# Patient Record
Sex: Male | Born: 1985 | Race: Asian | Hispanic: No | Marital: Married | Smoking: Former smoker
Health system: Southern US, Community
[De-identification: ages and names within clinical notes are randomized; demographics above are authoritative.]

## PROBLEM LIST (undated history)

## (undated) DIAGNOSIS — J939 Pneumothorax, unspecified: Secondary | ICD-10-CM

## (undated) HISTORY — DX: Pneumothorax, unspecified: J93.9

---
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
## 2007-01-02 ENCOUNTER — Ambulatory Visit: Payer: Self-pay | Admitting: Cardiothoracic Surgery

## 2007-01-02 ENCOUNTER — Inpatient Hospital Stay (HOSPITAL_COMMUNITY): Admission: AD | Admit: 2007-01-02 | Discharge: 2007-01-10 | Payer: Self-pay | Admitting: Surgery

## 2007-01-03 ENCOUNTER — Encounter: Payer: Self-pay | Admitting: Cardiothoracic Surgery

## 2007-01-17 ENCOUNTER — Encounter: Admission: RE | Admit: 2007-01-17 | Discharge: 2007-01-17 | Payer: Self-pay | Admitting: Cardiothoracic Surgery

## 2007-01-17 ENCOUNTER — Ambulatory Visit: Payer: Self-pay | Admitting: Cardiothoracic Surgery

## 2007-01-31 ENCOUNTER — Ambulatory Visit: Payer: Self-pay | Admitting: Cardiothoracic Surgery

## 2007-01-31 ENCOUNTER — Encounter: Admission: RE | Admit: 2007-01-31 | Discharge: 2007-01-31 | Payer: Self-pay | Admitting: Cardiothoracic Surgery

## 2007-06-18 IMAGING — CR DG CHEST 2V
2 series · 2 of 2 positions shown · non-contrast
Comparison: 01/09/2007

CLINICAL DATA: Followup spontaneous left pneumothorax.
 CHEST - 2 VIEW:

[w chest pa]
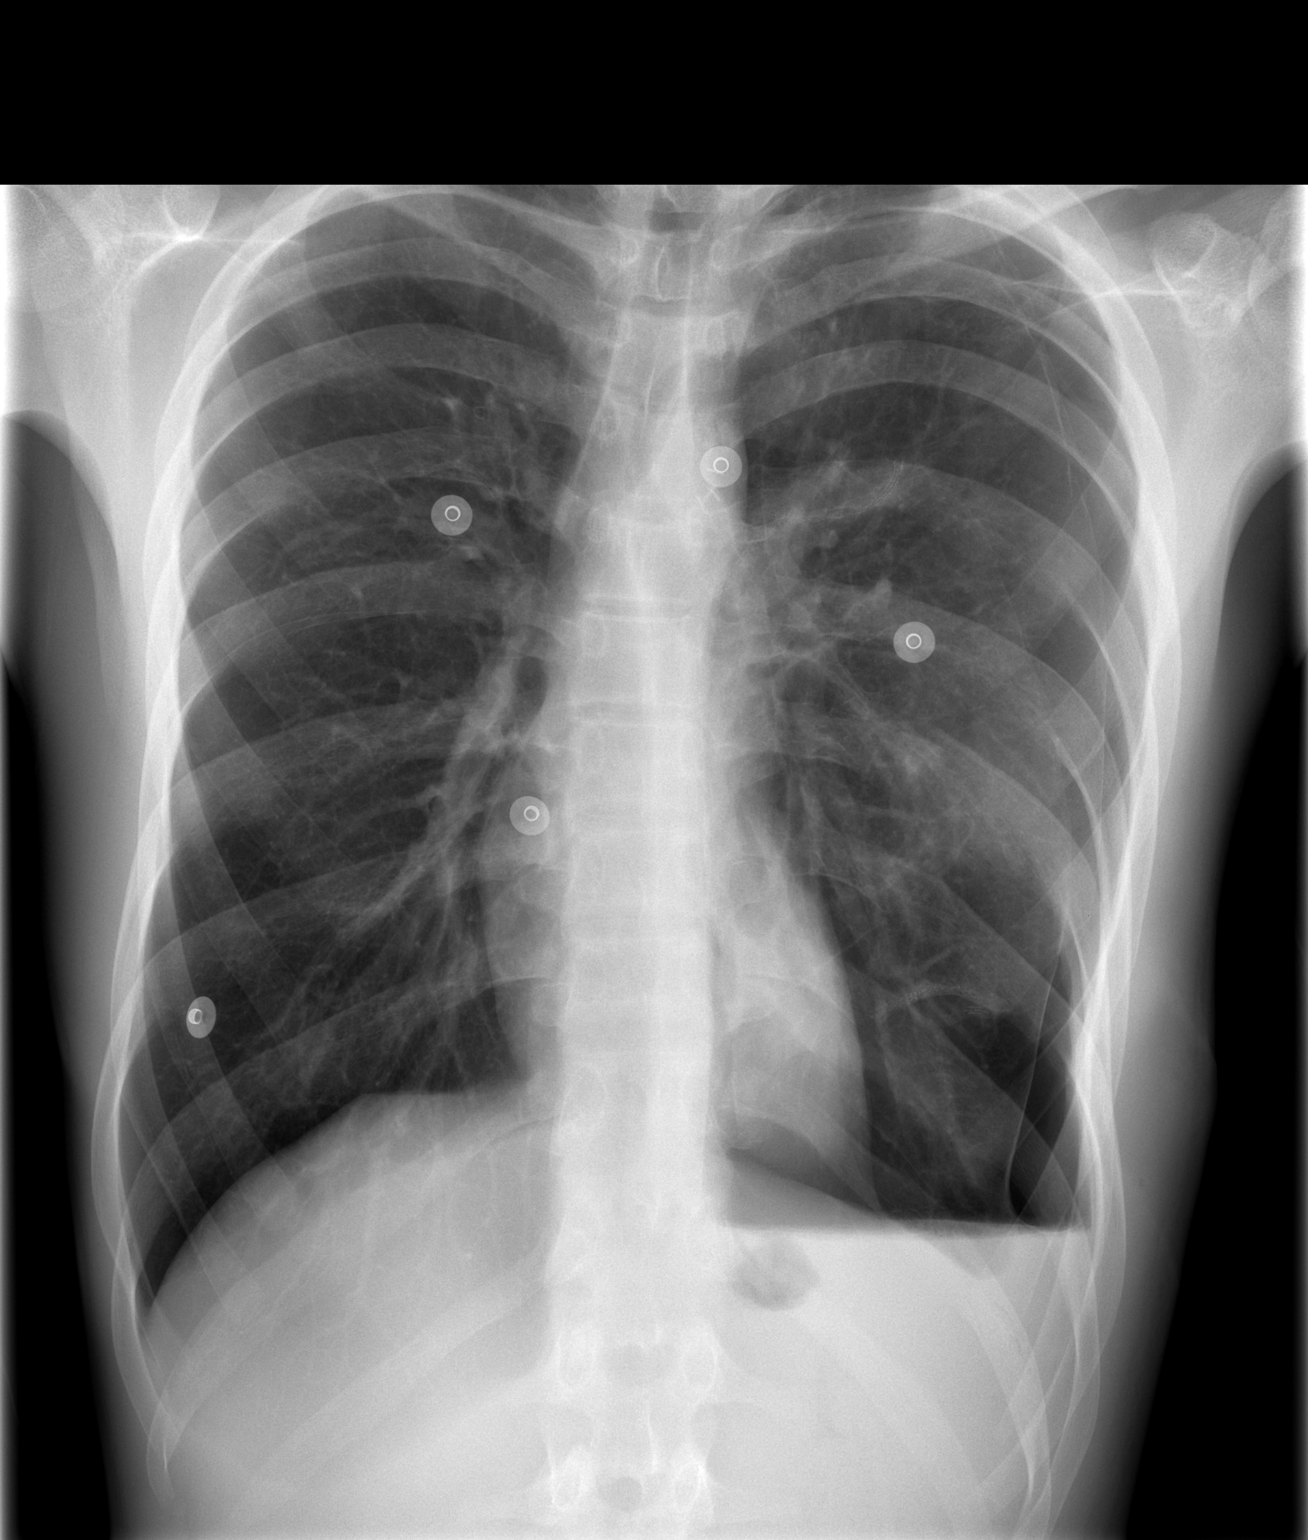

[w chest lat]
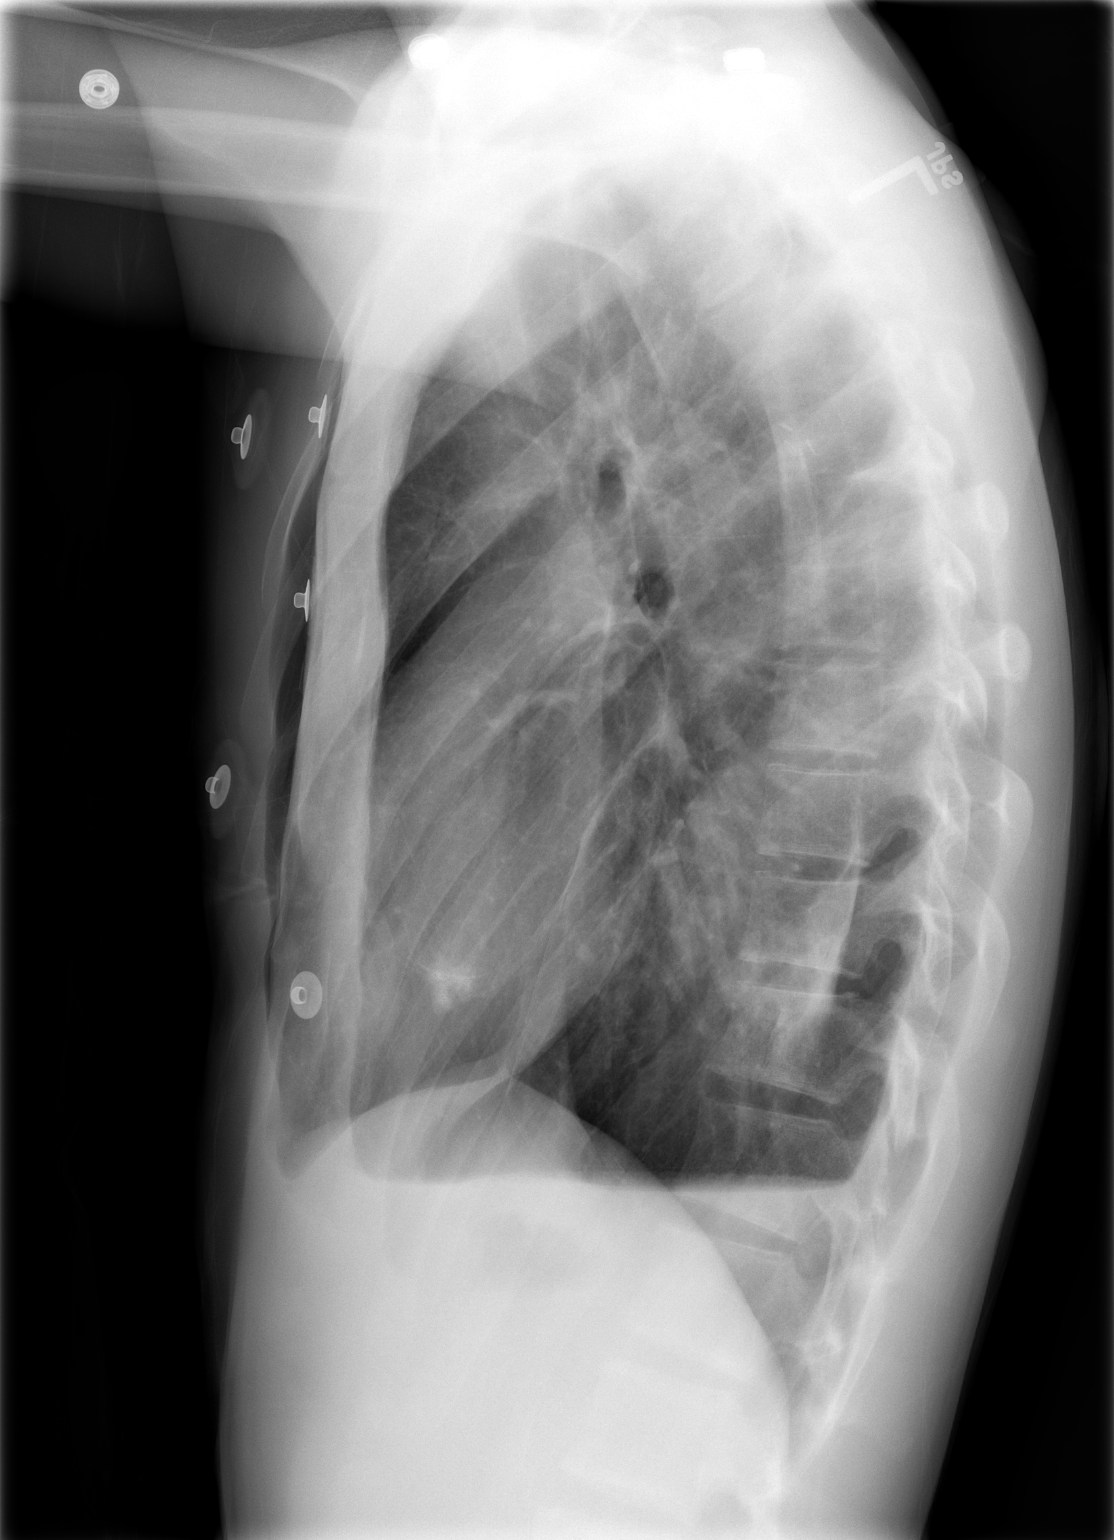

[2 of 2 positions shown; findings below may reference images not displayed]

FINDINGS: Moderate left hydropneumothorax has not significantly changed in size.  Both lungs remain clear.  Heart size is normal.
IMPRESSION: Stable left hydropneumothorax.

## 2011-03-02 NOTE — Op Note (Signed)
NAME:  Gerald Mcdaniel, Gerald Mcdaniel                   ACCOUNT NO.:  [id number]   MEDICAL RECORD NO.:  [id number]          PATIENT TYPE:  INP   LOCATION:  2311                         FACILITY:  [facility]   PHYSICIAN:  Kerin Perna, M.D.  DATE OF BIRTH:  Mar 09, 1986   DATE OF PROCEDURE:  01/03/2007  DATE OF DISCHARGE:                               OPERATIVE REPORT   OPERATION:  Left video assisted thoracoscopic surgery, mini-thoracotomy  with wedge resection of left upper lobe bleb and pleurectomy of the  parietal pleura of the left hemithorax.   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   POSTOPERATIVE DIAGNOSIS:  Recurrent spontaneous pneumothorax status post video assisted  thoracoscopic surgery and pleurodesis and bleb resection in April 2007.   SURGEON:  Kerin Perna, M.D.   ASSISTANT:  Coral Ceo, PA-C   ANESTHESIA:  General.   INDICATIONS:  The patient is a 25 year old Asian male who had an episode  of spontaneous pneumothorax in April 2007 which did not resolve with a  chest tube.  He underwent a left VATS bleb resection and pleurodesis by  Dr. Laneta Simmers and did well until 48 hours prior to admission when he  developed recurrent pleuritic chest and shoulder pain on the left side  and was found to have a 20-25% recurrent left pneumothorax.  A CT scan  showed possible residual blebs in the left apex which were small.  It  was felt that a pleurectomy would be the best operation in this young  man with already recurrence of a significant pneumothorax a year after  prior VATS.  I discussed the procedure with the patient and his parents  including alternatives to surgery and the risks including incisional  pain, bleeding, persistent air leak, infection.  The patient  demonstrated his understanding and agreed to proceed.   PROCEDURE:  The patient was brought to operating room  and placed supine  on the operating table where the left chest was prepped and draped after  the appropriate preoperative time out was completed.  The left pleural  space was examined with the thoracoscope through a small incision in the  fifth interspace.  There were very minimal adhesions in the pleural  space only at the left apex.  There were blebs which were small seen at  the lingular area and also in the posterior aspect of the left upper  lobe at the apex.  The camera was then removed and the incision was  extended in the fifth interspace.  The lung was mobilized.  The area of  bleb disease in the lingula was resected with an Endo-GIA stapler.  The  area of bleb disease in the posterior aspect of the left upper lobe  apical area was resected with an Endo-GIA staple incision.  A pleural  parietal pleurectomy was then performed anteriorly and posteriorly from  the apex to the seventh interspace.  Bleeding was controlled with  electrocautery and Liga clips.  The area was irrigated and inspected  with  the camera and found to have an adequate circumferential  pleurectomy.  A 36 straight chest tube was placed and directed to the  apex posteriorly.  Pericostals of #2 Vicryl were used to reapproximate  the ribs.  The  muscle was closed with interrupted #1 Vicryl.  An On-Q wound catheter  irrigation system was then placed and secured to the skin.  The  subcutaneous layer and skin were then closed with running Vicryl.  Sterile dressings were applied and the patient was extubated in the  operating room and returned to the recovery room in stable condition.      Kerin Perna, M.D.  Electronically Signed     PV/MEDQ  D:  01/03/2007  T:  01/03/2007  Job:  [id number]

## 2011-03-02 NOTE — H&P (Signed)
NAME:  Gerald Mcdaniel, Gerald Mcdaniel                   ACCOUNT NO.:  [id number]   MEDICAL RECORD NO.:  [id number]          PATIENT TYPE:  INP   LOCATION:  2004                         FACILITY:  [facility]   PHYSICIAN:  Kerin Perna, M.D.  DATE OF BIRTH:  April 13, 1986   DATE OF ADMISSION:  01/02/2007  DATE OF DISCHARGE:                              HISTORY & PHYSICAL   ADMISSION DIAGNOSIS:  Recurrent left spontaneous pneumothorax.   CHIEF COMPLAINT:  Left chest and shoulder pain.   HISTORY OF PRESENT ILLNESS:  Mr. Wotton is a 25 year old [place] male with  a history of left spontaneous pneumothorax. In March of 2007 he was  admitted by Dr. Laneta Simmers for a total left spontaneous pneumothorax and was  treated with a chest tube. Because of a persistent air leak he underwent  a left VATS with stapling of blebs and pleurodesis on January 18, 2006.  He  had an uncomplicated recovery and did well. He was seen in the emergency  department in February with some left shoulder discomfort and a chest x-  ray at that time showed a 5% left apical pneumothorax. He subsequently  cleared his symptoms and did well until early this morning when he  presented with recurrent left shoulder and neck discomfort to the  emergency department at [facility] where a chest x-ray  demonstrated a left 20% spontaneous pneumothorax. There is no evidence  of pulmonary infiltrate or pleural effusion.  Because of the recurrent  pneumothorax, a thoracic surgical evaluation was requested. The patient  denies any history of falls or trauma to the chest. He denies coughing.  He is a non cigarette smoker.   PAST MEDICAL HISTORY:  1. No known allergies.  2. No medications.  3. No prior surgical procedures other than the left VATS, apical bleb      stapling in April, 2007. He is a nonsmoker.   FAMILY HISTORY:  Negative for spontaneous pneumothorax.   REVIEW OF SYSTEMS:  Negative for fever, weight loss, hoarseness or  difficulty swallowing.  THORACIC: Review is positive for his recurrent  left pneumothorax, 20%. CARDIAC: Review is negative for arrhythmia,  murmur or rheumatic heart disease. GI review is negative for hepatitis  or jaundice. ENDOCRINE: Review is negative for diabetes or thyroid  disease. VASCULAR: Review is negative for DVT or TIA. NEUROLOGIC: Review  is negative for stroke or seizure or concussion.   PHYSICAL EXAMINATION:  GENERAL: The patient is a 25 year old young, thin  white Asian male in the emergency department in no distress.  HEENT EXAM: Normocephalic. Full EOMs. Pharynx clear. Dentition good.  NECK: Without crepitus or mass.  LYMPHATICS: No palpable cervical or supraclavicular adenopathy.  LUNGS: Breath sounds are slightly diminished on the left. He has a well-  healed left VATS incision. Breath sounds on the right are clear.  CARDIAC EXAM: Regular rhythm without S3 gallop or murmur.  ABDOMEN: Exam is soft, scaphoid without focal tenderness or pulsatile  mass.  EXTREMITIES: Reveal no edema, cyanosis or clubbing.  VASCULAR EXAM: Reveals 2+ pulses in all extremities.  SKIN: Without rash or  lesion.  NEUROLOGIC EXAM: Alert and oriented without focal motor deficit.   LABORATORY DATA:  A chest x-ray is reviewed and shows a lateral left  pneumothorax extending from the apex down to the diaphragm. It is  estimated at 20%.  The patient underwent a subsequent CT scan of the chest. This shows some  postoperative adhesions in the left apex. He has a few small apical  blebs.  He has no visible blebs in the lower lobe. The pneumothorax on  CT scan is 20-25%.   IMPRESSION AND PLAN:  The patient has had a recurrent pneumothorax  despite a left VATS with apical bleb stapling and pleurodesis. The  patient would probably benefit from a second procedure to do a  pleurectomy to ensure that this does not keep repeating. This will be  discussed with the patient and family and scheduled within the next 24  to 48  hours.      Kerin Perna, M.D.  Electronically Signed     PV/MEDQ  D:  01/02/2007  T:  01/02/2007  Job:  [id number]   cc:   CVTS Office

## 2011-03-02 NOTE — Discharge Summary (Signed)
NAME:  Gerald Mcdaniel, Gerald Mcdaniel                   ACCOUNT NO.:  [id number]   MEDICAL RECORD NO.:  [id number]          PATIENT TYPE:  INP   LOCATION:  5736                         FACILITY:  [facility]   PHYSICIAN:  Evelene Croon, M.D.     DATE OF BIRTH:  Jul 04, 1986   DATE OF ADMISSION:  01/12/2006  DATE OF DISCHARGE:  01/19/2006                                 DISCHARGE SUMMARY   ADMISSION DIAGNOSIS:  Spontaneous left pneumothorax.   DISCHARGE DIAGNOSIS:  Spontaneous left pneumothorax, status post left video-  assisted thoracoscopy and left apical bleb stapling.   PROCEDURE:  Left VATs with stapling of apical blebs and mechanical  pleurodesis by Dr. Evelene Croon.   SERVICE:  CVTS.   CONSULTATIONS:  None.   HISTORY AND PHYSICAL EXAM:  This is an 25 year old healthy Asian gentleman  who developed left chest pain on January 11, 2006.  The chest pain had  persisted throughout the day and he developed shortness of breath on January 12, 2006.  Subsequently, the patient went to [facility] Emergency  Room  where he had a chest x-ray taken which showed a large left  pneumothorax.  Chest x-ray repeated showed a left pneumothorax which shifted  to the right.  The patient denies any previous or surgical illnesses.  He  does smoke one pack per day since age 56.   On exam, the patient was a thin Asian gentleman in no acute distress.  He  had decreased breath sounds at the left base of his lung.  Heart regular  rate and rhythm.   HOSPITAL COURSE:  Chest tube was inserted on January 12, 2006 in the left  pleural space without any difficulty.  A chest x-ray followed showed the  tube was in good position without pneumothorax.   On January 13, 2006, the patient remained afebrile with vital signs stable.  His lungs are clear.  His chest tube did show a small air leak; however, the  chest x-ray did not show a pneumothorax.  The patient was placed still to  suction.   On January 14, 2006, the patient still had a  small air leak.  Chest x-ray did  show no pneumothorax.   On January 15, 2006, there was still a small air leak and it was best thought  the patient undergo a VATs.   On January 16, 2006, the patient underwent a left VATs and stapling of apical  bleb and mechanical pleurodesis.  The patient tolerated this procedure well.  On postoperative day one, the patient remained afebrile.  Vital signs  stable.  He had decreased breath sounds at the left base.  His chest tube  showed no air leak and his chest x-ray was stable.  His chest tube was  placed to water seal on January 17, 2006.  On January 18, 2006, the patient  remained stable without chest pain or shortness of breath.  He remained  afebrile.  He does still have some decrease breath sounds to the left base.  His chest x-ray was stable without pneumothorax and his chest  tube was  without air leak.   PHYSICAL EXAMINATION:  CVS:  Regular rate and rhythm.  RESPIRATORY:  Decreased sounds at the bases.  ABDOMEN:  Positive bowel sounds.  Soft, nontender, and nondistended.  EXTREMITIES:  No edema.  Pulses 2+.  Incisions clear, dry and intact.  CHEST:  Chest tube will be removed on January 18, 2006.  A follow-up chest x-  ray will be taken.   CONDITION ON DISCHARGE:  Good.   DISPOSITION:  The patient will be discharged to home.   DISCHARGE MEDICATIONS:  Tylox 1-2 tablets p.o. every four hours p.r.n.   DISCHARGE INSTRUCTIONS:  The patient is instructed to follow a regular diet.  He is to do no driving for two weeks.  The patient is do no heavy lifting  greater than 10 pounds for three weeks. The patient is to increase activity  slowly.  He may shower, bathe and clean his wounds with mild soap and water.  The patient was instructed to call the office if any wound problems such as  erythema, drainage, temperature greater than 101.5 or an increase in  shortness of breath.   FOLLOW UP:  The patient has a follow-up appointment with Dr. Evelene Croon in  one  week with a chest x-ray.      Constance Holster, [place]      Evelene Croon, M.D.  Electronically Signed    JMW/MEDQ  D:  01/18/2006  T:  01/19/2006  Job:  [id number]   cc:   Patient's chart   Evelene Croon, M.D.  [address]  [place]  [place] [address]

## 2011-03-02 NOTE — Op Note (Signed)
NAME:  Gerald Mcdaniel, Gerald Mcdaniel                   ACCOUNT NO.:  [id number]   MEDICAL RECORD NO.:  [id number]          PATIENT TYPE:  INP   LOCATION:  3305                         FACILITY:  [facility]   PHYSICIAN:  Evelene Croon, M.D.     DATE OF BIRTH:  1986-09-28   DATE OF PROCEDURE:  01/16/2006  DATE OF DISCHARGE:                                 OPERATIVE REPORT   PREOPERATIVE AND POSTOPERATIVE DIAGNOSIS:  Spontaneous left pneumothorax.   OPERATIVE PROCEDURE:  Left video-assisted thoracoscopy with stapling of left  apical bleb and mechanical pleurodesis.   ATTENDING SURGEON:  Evelene Croon, M.D.   ASSISTANT:  Belenda Cruise [facility]   ANESTHESIA:  General endotracheal.   CLINICAL HISTORY:  This patient is an 25 year old gentleman who was admitted  about 4 days ago with a spontaneous left pneumothorax with complete collapse  of his left lung.  He underwent chest tube placement in the emergency room  with complete re-expansion of this lung.  He continued to have a small  intermittent air leak from the chest tube.  After 72 hours, I felt that it  would be best to proceed with surgical correction since there was no change  in the small intermittent air leak.  I discussed operative procedure of  video-assisted thoracoscopy with stapling of blebs with the patient and his  mother.  I discussed the alternative of continuing chest tube drainage.  I  discussed the benefits and risks of surgery including bleeding, infection,  injury to lung, air leak, and recurrent pneumothorax.  He understood and  agreed to proceed.   OPERATIVE PROCEDURE:  The patient was taken to the operating room, placed on  table supine position.  After induction of general endotracheal anesthesia,  the patient was positioned in the right lateral decubitus position with the  left side up.  Then the left side of the chest prepped with Betadine soap  and solution and draped usual sterile manner.  A time-out was taken and the  proper  patient, proper operative site and proper operation were confirmed  the nursing and anesthesia staff.   Then a small transverse incision was made in the mid axillary line over the  seventh intercostal space.  The pleural space was entered and a 10 mm trocar  was inserted.  The 0 degrees thoracoscope was inserted.  Examination of  pleural space showed no visible abnormality.  There were a few bubbles  present near the apex of the lung.  Examination of lung itself showed that  there was a small bleb at the apex of the lung.  There were no other  abnormalities noted in the upper or lower lobes.  I also carefully inspected  the superior segment of the left lower lobe and saw no visible blebs.  Then  another small incision was made in the auscultatory triangle on the left  side of the back.  Through this area a linear noncutting stapler was  inserted.  A clamp was then inserted through the previous chest tube  incision in the anterior axillary line and the apex of the  lung was grasped.  The area of the apical bleb was then stapled with a single firing of the  noncutting stapler.  This resulted in complete exclusion of the apical bleb.  There is complete hemostasis.  The stapler was removed.  Then a mechanical  pleurodesis was performed using a small piece of an abrasivecauterypad  cleaner.  This was used to abrade the parietal pleural surface over the  upper one half of the chest.  Care was taken not to injure any of the vital  structures.  This abrasive pad was then removed.  Then a 28-French chest  tube was reinserted through the anterior axillary incision and positioned  with the tip near the apex of the chest.  The left lung was then re-  expanded.  There is no visible air leak.  Then the two incisions were closed  with a 2-0 Vicryl subcutaneous suture and a 3-0 Vicryl subcuticular skin  closure.  The sponge, needles and instrument counts correct according to  scrub nurse.  Dry sterile  dressing applied over the incisions and around the  chest tube which was hooked to Pleur-Evac suction.  The patient was then  awakened, extubated and transferred to the post anesthesia care unit in  satisfactory and stable condition.      Evelene Croon, M.D.  Electronically Signed     BB/MEDQ  D:  01/16/2006  T:  01/17/2006  Job:  [id number]   cc:   CVTS office

## 2011-03-02 NOTE — Discharge Summary (Signed)
NAME:  Gerald Mcdaniel, Gerald Mcdaniel                   ACCOUNT NO.:  [id number]   MEDICAL RECORD NO.:  [id number]          PATIENT TYPE:  INP   LOCATION:  2038                         FACILITY:  [facility]   PHYSICIAN:  Kerin Perna, M.D.  DATE OF BIRTH:  23-Jun-1986   DATE OF ADMISSION:  01/02/2007  DATE OF DISCHARGE:  01/10/2007                               DISCHARGE SUMMARY   PRIMARY ADMITTING DIAGNOSIS:  Recurrent left spontaneous pneumothorax.   ADDITIONAL/DISCHARGE DIAGNOSES:  1. Recurrent left spontaneous pneumothorax.  2. History of left video-assisted thoracoscopic surgery (VATS) with      stapling of blebs and pleurodesis in April 2007 by Dr. Laneta Simmers.   PROCEDURES PERFORMED:  Left VATS and left mini-thoracotomy with left  upper lobe wedge resection and pleurectomy.   HISTORY:  The patient is a 25 year old Asian male with a history of  spontaneous left pneumothorax which occurred in April 2007.  He was seen  at that time by Dr. Evelene Croon and a chest tube was placed without  resolution of his pneumothorax.  He subsequently underwent a left VATS  with lobe resection and pleurodesis by Dr. Laneta Simmers.  He was discharged  home in good condition and has done well since that time until about 48  hours prior to this admission when he developed recurrent pleuritic  chest and shoulder pain on the left side.   He presented to the emergency department for further evaluation at  [facility], and a chest x-ray showed a recurrent left  spontaneous pneumothorax about 20-25%.  Because of this, a  Cardiothoracic Surgery consultation was obtained and the patient was  seen by Dr. Kathlee Nations Trigt.  Dr. Donata Clay recommended placing chest  tube and admitting the patient for further evaluation and treatment.  He  was subsequently transferred to [facility] and a chest tube was  placed and he was admitted for followup care.   HOSPITAL COURSE:  A CT scan was performed, which showed possible  residual blebs in the left apex, which were small.  Because the patient  had had a recurrence so soon after his previous surgery, it was  recommended that he undergo a VATS with lobe resection and pleurectomy  at this time.  Dr. Donata Clay explained the risks, benefits and  alternatives of the procedure to the patient and family, and they agreed  to proceed with surgery.   He was taken to the operating room on 01/03/2007 and underwent a left  VATS with left upper lobe bleb resection and pleurectomy.  He tolerated  the procedure well and was transferred to the floor in stable condition.   His postoperative course was uneventful initially.  His chest tube was  removed on postop day #3 after no air leak was noted and his x-rays had  remained stable.  Following removal of his chest tube, he had a  recurrence of a pneumothorax, up to 20% on the left.  Because of this,  he was sent to Interventional Radiology for placement of pigtail chest  tube.  This was done on 01/07/2007.  Following placement of a pigtail  catheter, his lung re-expanded and there was minimal residual  pneumothorax.  His chest tube was followed for the next 48 hours.  He  was decreased to water seal.  His chest tube was able to be removed on  01/09/2007.  Following the removal of his chest tube, he had a minimal  10-15% pneumothorax which has remained stable over the ensuing 24 hours.  He is otherwise doing very well.  He has remained afebrile and all vital  signs have been stable.  His incisions are all healing well.  He is  ambulating in the halls without difficulty.  He is tolerating a regular  diet and is having normal bowel and bladder function.  He has been seen  today and his films have been reviewed by Dr. Donata Clay, and it is felt  that since his pneumothorax has remained stable, he may be discharged  home at this time.   DISCHARGE INSTRUCTIONS:  He is asked to refrain from driving, heavy  lifting or strenuous  activity.  He may continue ambulating daily and  using his incentive spirometer.  He may shower daily and clean his  incisions with soap and water.  He will continue with same preoperative  diet.   DISCHARGE MEDICATIONS:  Vicodin 5/500, one to two q.4 h p.r.n. for pain.   DISCHARGE FOLLOWUP:  He will see Dr. Donata Clay back in the office in 1  week, and our office will be contacting the patient with an appointment  as well as the time for a chest x-ray at [facility], which  will be approximately 1 hour prior to this appointment in our office.  In the interim, if he experiences any problems or has questions, he is  asked to contact our office immediately.      Coral Ceo, P.A.      Kerin Perna, M.D.  Electronically Signed    GC/MEDQ  D:  01/10/2007  T:  01/10/2007  Job:  [id number]   cc:   Kerin Perna, M.D.

## 2016-06-03 DIAGNOSIS — R0789 Other chest pain: Secondary | ICD-10-CM | POA: Diagnosis not present

## 2016-06-03 DIAGNOSIS — J9383 Other pneumothorax: Secondary | ICD-10-CM | POA: Diagnosis not present

## 2016-06-03 DIAGNOSIS — J939 Pneumothorax, unspecified: Secondary | ICD-10-CM | POA: Diagnosis not present

## 2016-06-03 DIAGNOSIS — R0602 Shortness of breath: Secondary | ICD-10-CM | POA: Diagnosis not present

## 2016-06-04 DIAGNOSIS — J939 Pneumothorax, unspecified: Secondary | ICD-10-CM | POA: Diagnosis not present

## 2016-06-05 DIAGNOSIS — Z48813 Encounter for surgical aftercare following surgery on the respiratory system: Secondary | ICD-10-CM | POA: Diagnosis not present

## 2016-06-06 DIAGNOSIS — J939 Pneumothorax, unspecified: Secondary | ICD-10-CM | POA: Diagnosis not present

## 2016-06-06 DIAGNOSIS — G8918 Other acute postprocedural pain: Secondary | ICD-10-CM | POA: Diagnosis not present

## 2016-06-07 DIAGNOSIS — G8918 Other acute postprocedural pain: Secondary | ICD-10-CM | POA: Diagnosis not present

## 2016-06-07 DIAGNOSIS — Z48813 Encounter for surgical aftercare following surgery on the respiratory system: Secondary | ICD-10-CM | POA: Diagnosis not present

## 2016-06-08 DIAGNOSIS — G8918 Other acute postprocedural pain: Secondary | ICD-10-CM | POA: Diagnosis not present

## 2016-06-08 DIAGNOSIS — J939 Pneumothorax, unspecified: Secondary | ICD-10-CM | POA: Diagnosis not present

## 2016-06-09 DIAGNOSIS — Z4682 Encounter for fitting and adjustment of non-vascular catheter: Secondary | ICD-10-CM | POA: Diagnosis not present

## 2016-06-09 DIAGNOSIS — G8918 Other acute postprocedural pain: Secondary | ICD-10-CM | POA: Diagnosis not present

## 2016-06-09 DIAGNOSIS — Z48813 Encounter for surgical aftercare following surgery on the respiratory system: Secondary | ICD-10-CM | POA: Diagnosis not present

## 2016-06-15 DIAGNOSIS — J939 Pneumothorax, unspecified: Secondary | ICD-10-CM

## 2016-06-15 HISTORY — DX: Pneumothorax, unspecified: J93.9

## 2016-06-15 HISTORY — PX: PLEURAL SCARIFICATION: SHX748

## 2016-06-19 DIAGNOSIS — J9383 Other pneumothorax: Secondary | ICD-10-CM | POA: Diagnosis not present

## 2016-06-19 DIAGNOSIS — J939 Pneumothorax, unspecified: Secondary | ICD-10-CM | POA: Diagnosis not present

## 2016-06-19 DIAGNOSIS — R0602 Shortness of breath: Secondary | ICD-10-CM | POA: Diagnosis not present

## 2017-03-04 DIAGNOSIS — S62009A Unspecified fracture of navicular [scaphoid] bone of unspecified wrist, initial encounter for closed fracture: Secondary | ICD-10-CM | POA: Diagnosis not present

## 2017-03-13 DIAGNOSIS — S62011A Displaced fracture of distal pole of navicular [scaphoid] bone of right wrist, initial encounter for closed fracture: Secondary | ICD-10-CM | POA: Diagnosis not present

## 2017-03-13 DIAGNOSIS — M25531 Pain in right wrist: Secondary | ICD-10-CM | POA: Diagnosis not present

## 2017-03-19 DIAGNOSIS — Y929 Unspecified place or not applicable: Secondary | ICD-10-CM | POA: Diagnosis not present

## 2017-03-19 DIAGNOSIS — S62015A Nondisplaced fracture of distal pole of navicular [scaphoid] bone of left wrist, initial encounter for closed fracture: Secondary | ICD-10-CM | POA: Diagnosis not present

## 2017-03-19 DIAGNOSIS — X509XXD Other and unspecified overexertion or strenuous movements or postures, subsequent encounter: Secondary | ICD-10-CM | POA: Diagnosis not present

## 2017-03-27 DIAGNOSIS — S62011D Displaced fracture of distal pole of navicular [scaphoid] bone of right wrist, subsequent encounter for fracture with routine healing: Secondary | ICD-10-CM | POA: Diagnosis not present

## 2017-05-15 DIAGNOSIS — W19XXXA Unspecified fall, initial encounter: Secondary | ICD-10-CM | POA: Diagnosis not present

## 2017-05-15 DIAGNOSIS — Y9351 Activity, roller skating (inline) and skateboarding: Secondary | ICD-10-CM | POA: Diagnosis not present

## 2017-05-15 DIAGNOSIS — Y929 Unspecified place or not applicable: Secondary | ICD-10-CM | POA: Diagnosis not present

## 2017-05-15 DIAGNOSIS — S62014D Nondisplaced fracture of distal pole of navicular [scaphoid] bone of right wrist, subsequent encounter for fracture with routine healing: Secondary | ICD-10-CM | POA: Diagnosis not present

## 2017-09-06 ENCOUNTER — Ambulatory Visit (INDEPENDENT_AMBULATORY_CARE_PROVIDER_SITE_OTHER): Payer: BLUE CROSS/BLUE SHIELD | Admitting: Family Medicine

## 2017-09-06 ENCOUNTER — Encounter: Payer: Self-pay | Admitting: Family Medicine

## 2017-09-06 ENCOUNTER — Other Ambulatory Visit: Payer: Self-pay

## 2017-09-06 VITALS — BP 130/80 | HR 69 | Resp 17 | Ht 70.47 in | Wt 126.0 lb

## 2017-09-06 DIAGNOSIS — Z0000 Encounter for general adult medical examination without abnormal findings: Secondary | ICD-10-CM | POA: Diagnosis not present

## 2017-09-06 DIAGNOSIS — Z113 Encounter for screening for infections with a predominantly sexual mode of transmission: Secondary | ICD-10-CM | POA: Diagnosis not present

## 2017-09-06 DIAGNOSIS — N529 Male erectile dysfunction, unspecified: Secondary | ICD-10-CM | POA: Diagnosis not present

## 2017-09-06 DIAGNOSIS — Z681 Body mass index (BMI) 19 or less, adult: Secondary | ICD-10-CM | POA: Diagnosis not present

## 2017-09-06 DIAGNOSIS — Z23 Encounter for immunization: Secondary | ICD-10-CM

## 2017-09-06 MED ORDER — TADALAFIL 10 MG PO TABS: 10.0000 mg | ORAL_TABLET | Freq: Every day | ORAL | 0 refills | Status: DC | PRN

## 2017-09-06 NOTE — Patient Instructions (Signed)
     IF you received an x-ray today, you will receive an invoice from [facility]. Please contact  Radiology at [phone] with questions or concerns regarding your invoice.   IF you received labwork today, you will receive an invoice from [facility]. Please contact [facility] at [phone] with questions or concerns regarding your invoice.   Our billing staff will not be able to assist you with questions regarding bills from these companies.  You will be contacted with the lab results as soon as they are available. The fastest way to get your results is to activate your My Chart account. Instructions are located on the last page of this paperwork. If you have not heard from us regarding the results in 2 weeks, please contact this office.     

## 2017-09-06 NOTE — Progress Notes (Signed)
11/23/20183:38 PM  Gerald Mcdaniel 1986-02-03, 31 y.o. male [id number]  Chief Complaint  Patient presents with  . Annual Exam    complete physical    HPI:   Patient is a 31 y.o. male with past medical history significant for spontaneous pneumothorax x 2 who presents today for annual exam.  Main concerns are: low BMI. Has been trying to gain weight. Calorie counting, goal 2600-2800. Does weights, feels getting stronger. Had been able to gain about 10 lbs but then got a new job with long hours, started skipping meals, weight loss. Also worried because started to have ED issues last month, wanting to make sure nothing organic going on.   Depression screen PHQ 2/9 09/06/2017  Decreased Interest 0  Down, Depressed, Hopeless 0  PHQ - 2 Score 0    No Known Allergies  Prior to Admission medications   Not on File    Past Medical History:  Diagnosis Date  . Pneumothorax on right 06/2016    Past Surgical History:  Procedure Laterality Date  . PLEURAL SCARIFICATION  06/2016    Social History   Tobacco Use  . Smoking status: Former Smoker    Types: Cigarettes  . Smokeless tobacco: Current User  Substance Use Topics  . Alcohol use: Yes    Alcohol/week: 6.0 oz    Types: 10 Cans of beer per week    Comment: sometimes he drinks daily and other times he goes weeks without drinking    Family History  Problem Relation Age of Onset  . Alzheimer's disease Maternal Grandmother   . Colon cancer Paternal Grandmother     Review of Systems  Constitutional: Positive for weight loss. Negative for chills, fever and malaise/fatigue.  Respiratory: Negative for cough and shortness of breath.   Cardiovascular: Negative for chest pain, palpitations and leg swelling.  Gastrointestinal: Negative for abdominal pain, nausea and vomiting.  Genitourinary: Negative for dysuria and hematuria.  Musculoskeletal: Negative for myalgias.  Neurological: Negative for dizziness and headaches.    Endo/Heme/Allergies: Negative for polydipsia.  Psychiatric/Behavioral: Negative for depression. The patient is not nervous/anxious.      OBJECTIVE:  Blood pressure 130/80, pulse 69, resp. rate 17, height 5' 10.47" (1.79 m), weight 126 lb (57.2 kg), SpO2 100 %.  Physical Exam  Constitutional: He is oriented to person, place, and time and well-developed, well-nourished, and in no distress.  HENT:  Head: Normocephalic and atraumatic.  Right Ear: Hearing, tympanic membrane, external ear and ear canal normal.  Left Ear: Hearing, tympanic membrane, external ear and ear canal normal.  Mouth/Throat: Oropharynx is clear and moist. No oropharyngeal exudate.  Eyes: Conjunctivae and EOM are normal. Pupils are equal, round, and reactive to light.  Neck: Neck supple. No thyromegaly present.  Cardiovascular: Normal rate, regular rhythm, normal heart sounds and intact distal pulses. Exam reveals no gallop and no friction rub.  No murmur heard. Pulmonary/Chest: Effort normal and breath sounds normal. He has no wheezes. He has no rales.  Abdominal: Soft. Bowel sounds are normal. He exhibits no distension and no mass. There is no tenderness.  Musculoskeletal: Normal range of motion. He exhibits no edema.  Lymphadenopathy:    He has no cervical adenopathy.  Neurological: He is alert and oriented to person, place, and time. He has normal reflexes. No cranial nerve deficit. Gait normal.  Skin: Skin is warm and dry.  Psychiatric: Mood and affect normal.    ASSESSMENT and PLAN  1. Annual physical exam Routine HCM labs ordered. HCM  reviewed/discussed. Anticipatory guidance regarding healthy weight, lifestyle and choices given.   - CBC with Differential/Platelet - Comprehensive metabolic panel - TSH  2. Need for prophylactic vaccination and inoculation against influenza - Flu Vaccine QUAD 36+ mos IM - Tdap vaccine greater than or equal to 7yo IM  3. BMI less than 19,adult - CBC with  Differential/Platelet - Comprehensive metabolic panel - TSH - Testosterone  4. Erectile dysfunction, unspecified erectile dysfunction type Discussed psychological and alcohol contributing factors, discussed new medication r/se/b.  - Testosterone - tadalafil (CIALIS) 10 MG tablet; Take 1 tablet (10 mg total) by mouth daily as needed for erectile dysfunction.  5. Screen for STD (sexually transmitted disease) - GC/Chlamydia Probe Amp - HIV antibody - RPR    Return in about 1 year (around 09/06/2018).    Myles LippsIrma M Santiago, MD [facility] [address] [place], [place] [id number] Ph.  [phone] Fax [phone]

## 2017-09-07 LAB — COMPREHENSIVE METABOLIC PANEL
ALT: 25 IU/L (ref 0–44)
AST: 25 IU/L (ref 0–40)
Albumin/Globulin Ratio: 2.1 (ref 1.2–2.2)
Albumin: 5.1 g/dL (ref 3.5–5.5)
Alkaline Phosphatase: 58 IU/L (ref 39–117)
BUN/Creatinine Ratio: 16 (ref 9–20)
BUN: 15 mg/dL (ref 6–20)
Bilirubin Total: 1 mg/dL (ref 0.0–1.2)
CO2: 21 mmol/L (ref 20–29)
Calcium: 9.7 mg/dL (ref 8.7–10.2)
Chloride: 103 mmol/L (ref 96–106)
Creatinine, Ser: 0.93 mg/dL (ref 0.76–1.27)
GFR calc Af Amer: 128 mL/min/{1.73_m2} (ref >59)
GFR calc non Af Amer: 111 mL/min/{1.73_m2} (ref >59)
Globulin, Total: 2.4 g/dL (ref 1.5–4.5)
Glucose: 78 mg/dL (ref 65–99)
Potassium: 4.2 mmol/L (ref 3.5–5.2)
Sodium: 141 mmol/L (ref 134–144)
Total Protein: 7.5 g/dL (ref 6.0–8.5)

## 2017-09-07 LAB — CBC WITH DIFFERENTIAL/PLATELET
Basophils Absolute: 0 10*3/uL (ref 0.0–0.2)
Basos: 0 %
EOS (ABSOLUTE): 0.1 10*3/uL (ref 0.0–0.4)
Eos: 2 %
Hematocrit: 45.6 % (ref 37.5–51.0)
Hemoglobin: 14.6 g/dL (ref 13.0–17.7)
Immature Grans (Abs): 0 10*3/uL (ref 0.0–0.1)
Immature Granulocytes: 0 %
Lymphocytes Absolute: 1 10*3/uL (ref 0.7–3.1)
Lymphs: 21 %
MCH: 26.9 pg (ref 26.6–33.0)
MCHC: 32 g/dL (ref 31.5–35.7)
MCV: 84 fL (ref 79–97)
Monocytes Absolute: 0.4 10*3/uL (ref 0.1–0.9)
Monocytes: 8 %
Neutrophils Absolute: 3.2 10*3/uL (ref 1.4–7.0)
Neutrophils: 69 %
Platelets: 212 10*3/uL (ref 150–379)
RBC: 5.43 x10E6/uL (ref 4.14–5.80)
RDW: 13.1 % (ref 12.3–15.4)
WBC: 4.6 10*3/uL (ref 3.4–10.8)

## 2017-09-07 LAB — TESTOSTERONE: Testosterone: 841 ng/dL (ref 264–916)

## 2017-09-07 LAB — RPR: RPR Ser Ql: NONREACTIVE

## 2017-09-07 LAB — TSH: TSH: 2.03 u[IU]/mL (ref 0.450–4.500)

## 2017-09-07 LAB — HIV ANTIBODY (ROUTINE TESTING W REFLEX): HIV Screen 4th Generation wRfx: NONREACTIVE

## 2017-09-10 LAB — GC/CHLAMYDIA PROBE AMP
Chlamydia trachomatis, NAA: NEGATIVE
Neisseria gonorrhoeae by PCR: NEGATIVE

## 2017-11-18 ENCOUNTER — Other Ambulatory Visit: Payer: Self-pay | Admitting: Family Medicine

## 2017-11-19 NOTE — Telephone Encounter (Signed)
Cialis refill Last OV: 09/06/17 Last Refill:09/06/17 Pharmacy:[facility]

## 2018-09-12 ENCOUNTER — Encounter: Payer: BLUE CROSS/BLUE SHIELD | Admitting: Family Medicine
